# Patient Record
Sex: Male | Born: 1950 | Race: White | Hispanic: No | Marital: Married
Health system: Southern US, Community
[De-identification: ages and names within clinical notes are randomized; demographics above are authoritative.]

## PROBLEM LIST (undated history)

## (undated) DIAGNOSIS — M810 Age-related osteoporosis without current pathological fracture: Secondary | ICD-10-CM

## (undated) DIAGNOSIS — J439 Emphysema, unspecified: Secondary | ICD-10-CM

## (undated) HISTORY — PX: TONSILLECTOMY: SUR1361

## (undated) HISTORY — PX: APPENDECTOMY: SHX54

---

## 2017-04-22 ENCOUNTER — Encounter (HOSPITAL_COMMUNITY): Payer: Self-pay | Admitting: Emergency Medicine

## 2017-04-22 ENCOUNTER — Emergency Department (HOSPITAL_COMMUNITY): Payer: Self-pay

## 2017-04-22 ENCOUNTER — Emergency Department (HOSPITAL_COMMUNITY)
Admission: EM | Admit: 2017-04-22 | Discharge: 2017-04-22 | Disposition: A | Discharge status: Home / Self Care | Payer: Self-pay | Attending: Emergency Medicine | Admitting: Emergency Medicine

## 2017-04-22 DIAGNOSIS — R911 Solitary pulmonary nodule: Secondary | ICD-10-CM | POA: Insufficient documentation

## 2017-04-22 DIAGNOSIS — R42 Dizziness and giddiness: Secondary | ICD-10-CM | POA: Insufficient documentation

## 2017-04-22 DIAGNOSIS — I10 Essential (primary) hypertension: Secondary | ICD-10-CM | POA: Insufficient documentation

## 2017-04-22 HISTORY — DX: Emphysema, unspecified: J43.9

## 2017-04-22 HISTORY — DX: Age-related osteoporosis without current pathological fracture: M81.0

## 2017-04-22 LAB — URINALYSIS, ROUTINE W REFLEX MICROSCOPIC
BACTERIA UA: NONE SEEN
BILIRUBIN URINE: NEGATIVE
Glucose, UA: NEGATIVE mg/dL
Ketones, ur: NEGATIVE mg/dL
LEUKOCYTES UA: NEGATIVE
NITRITE: NEGATIVE
Protein, ur: NEGATIVE mg/dL
Specific Gravity, Urine: 1.002 — ABNORMAL LOW (ref 1.005–1.030)
Squamous Epithelial / LPF: NONE SEEN
pH: 7 (ref 5.0–8.0)

## 2017-04-22 LAB — BASIC METABOLIC PANEL
ANION GAP: 8 (ref 5–15)
BUN: <5 mg/dL — ABNORMAL LOW (ref 6–20)
CO2: 29 mmol/L (ref 22–32)
Calcium: 9.3 mg/dL (ref 8.9–10.3)
Chloride: 99 mmol/L — ABNORMAL LOW (ref 101–111)
Creatinine, Ser: 0.84 mg/dL (ref 0.61–1.24)
GFR calc Af Amer: >60 mL/min (ref >60)
GLUCOSE: 104 mg/dL — AB (ref 65–99)
POTASSIUM: 4.3 mmol/L (ref 3.5–5.1)
Sodium: 136 mmol/L (ref 135–145)

## 2017-04-22 LAB — CBC
HEMATOCRIT: 40.3 % (ref 39.0–52.0)
HEMOGLOBIN: 13.7 g/dL (ref 13.0–17.0)
MCH: 33.8 pg (ref 26.0–34.0)
MCHC: 34 g/dL (ref 30.0–36.0)
MCV: 99.5 fL (ref 78.0–100.0)
Platelets: 234 10*3/uL (ref 150–400)
RBC: 4.05 MIL/uL — ABNORMAL LOW (ref 4.22–5.81)
RDW: 14.2 % (ref 11.5–15.5)
WBC: 7.5 10*3/uL (ref 4.0–10.5)

## 2017-04-22 MED ORDER — SODIUM CHLORIDE 0.9 % IV BOLUS (SEPSIS)
1000.0000 mL | Freq: Once | INTRAVENOUS | Status: AC
  Administered 2017-04-22: 1000 mL via INTRAVENOUS

## 2017-04-22 MED ORDER — DOXYCYCLINE HYCLATE 100 MG PO TABS
200.0000 mg | ORAL_TABLET | Freq: Once | ORAL | Status: AC
  Administered 2017-04-22: 200 mg via ORAL
  Filled 2017-04-22: qty 2

## 2017-04-22 NOTE — ED Triage Notes (Signed)
Patient reports "several" tick bites over the past month. Reports headache, muscle aches, and dizziness with near syncopal episodes x3 days. Hx [place] spotted fever

## 2017-04-22 NOTE — ED Provider Notes (Signed)
[facility]-EMERGENCY DEPT Provider Note   CSN: [id number] Arrival date & time: 04/22/17  1522     History   Chief Complaint Chief Complaint  Patient presents with  . Insect Bite  . Headache  . Dizziness    HPI Kathleen ArgueCharles Mcclary is a 66 y.o. male.  Patient is a 66 year old male with a history of COPD and osteoporosis. He presents with generalized myalgias and weakness. He states he's been feeling bad for about the last week with intermittent dizziness and blurred vision as well as myalgias and joint pains. He denies any joint swelling. No rashes. He's had some intermittent headaches. He has remote [facility] several decades [facility] per his report. He denies any known fevers. He lives mostly time outside and has been all over the country camping. He states he frequently pulls ticks off of him. He denies any cough or chest congestion other than his baseline cough which he attributes to his COPD. He has a little bit of pain in his right mid back. It's worse with movement and coughing. It's not worse with breathing. He denies any shortness of breath. He states he's been eating and drinking okay but does stay outside for most of the day. He denies any nausea vomiting or diarrhea.      Past Medical History:  Diagnosis Date  . Emphysema of lung (HCC)   . Osteoporosis     There are no active problems to display for this patient.   Past Surgical History:  Procedure Laterality Date  . APPENDECTOMY    . TONSILLECTOMY         Home Medications    Prior to Admission medications   Not on File    Family History No family history on file.  Social History Social History  Substance Use Topics  . Smoking status: Not on file  . Smokeless tobacco: Not on file  . Alcohol use Not on file     Allergies   Patient has no known allergies.   Review of Systems Review of Systems  Constitutional: Positive for fatigue. Negative for chills, diaphoresis and fever.    HENT: Negative for congestion, rhinorrhea and sneezing.   Eyes: Negative.   Respiratory: Positive for cough. Negative for chest tightness and shortness of breath.   Cardiovascular: Negative for chest pain and leg swelling.  Gastrointestinal: Negative for abdominal pain, blood in stool, diarrhea, nausea and vomiting.  Genitourinary: Negative for difficulty urinating, flank pain, frequency and hematuria.  Musculoskeletal: Positive for arthralgias and myalgias. Negative for back pain.  Skin: Negative for rash.  Neurological: Positive for dizziness and headaches. Negative for speech difficulty, weakness and numbness.     Physical Exam Updated Vital Signs BP (!) 182/66 (BP Location: Left Arm)   Pulse (!) 52   Temp 97.8 F (36.6 C) (Oral)   Resp 18   Ht 6\' 3"  (1.905 m)   Wt 81.6 kg (180 lb)   SpO2 100%   BMI 22.50 kg/m   Physical Exam  Constitutional: He is oriented to person, place, and time. He appears well-developed and well-nourished.  HENT:  Head: Normocephalic and atraumatic.  Eyes: Pupils are equal, round, and reactive to light.  Neck: Normal range of motion. Neck supple.  Cardiovascular: Normal rate, regular rhythm and normal heart sounds.   Pulmonary/Chest: Effort normal and breath sounds normal. No respiratory distress. He has no wheezes. He has no rales. He exhibits no tenderness.  Abdominal: Soft. Bowel sounds are normal. There is  no tenderness. There is no rebound and no guarding.  Musculoskeletal: Normal range of motion. He exhibits no edema.  No joint swelling or erythema  Lymphadenopathy:    He has no cervical adenopathy.  Neurological: He is alert and oriented to person, place, and time.  Motor 5/5 all extremities Sensation grossly intact to LT all extremities Finger to Nose intact, no pronator drift CN II-XII grossly intact Gait normal   Skin: Skin is warm and dry. No rash noted.  Multiple insect bites.  No induration or surrounding erythema. No rash   Psychiatric: He has a normal mood and affect.     ED Treatments / Results  Labs (all labs ordered are listed, but only abnormal results are displayed) Labs Reviewed  BASIC METABOLIC PANEL - Abnormal; Notable for the following:       Result Value   Chloride 99 (*)    Glucose, Bld 104 (*)    BUN <5 (*)    All other components within normal limits  CBC - Abnormal; Notable for the following:    RBC 4.05 (*)    All other components within normal limits  URINALYSIS, ROUTINE W REFLEX MICROSCOPIC - Abnormal; Notable for the following:    Color, Urine STRAW (*)    Specific Gravity, Urine 1.002 (*)    Hgb urine dipstick SMALL (*)    All other components within normal limits  ROCKY MTN SPOTTED FVR ABS PNL(IGG+IGM)  B. BURGDORFI ANTIBODIES  CBG MONITORING, ED    EKG  EKG Interpretation  Date/Time:  Tuesday April 22 2017 15:46:33 EDT Ventricular Rate:  48 PR Interval:    QRS Duration: 111 QT Interval:  449 QTC Calculation: 402 R Axis:   75 Text Interpretation:  Sinus bradycardia Probable left atrial enlargement Left ventricular hypertrophy Baseline wander in lead(s) V6 Confirmed by Rolan Bucco [phone]) on 04/22/2017 4:03:44 PM       Radiology Dg Chest 2 View  Result Date: 04/22/2017 CLINICAL DATA:  Several tick bites over the past month. Reports headache, muscle ache and dizziness with near syncope x3 days. History of emphysema and [facility] spotted fever. Cough and weakness. EXAM: CHEST  2 VIEW COMPARISON:  None. FINDINGS: There is emphysematous hyperinflation of the lungs without pneumonic consolidation. Bilateral diffuse interstitial prominence consistent with chronic interstitial lung disease and interstitial fibrosis is noted bilaterally. There is a well-circumscribed dense appearing 12 mm pulmonary nodule in the left upper lobe possibly sequela of old granulomatous disease or possibly a hamartoma. CT may help for baseline documentation and assessment. Biapical  pleuroparenchymal scarring is seen. Tiny nodular density at the right lung base likely reflects the patient's nipple shadow. Mild degenerative changes are seen along the dorsal spine. Heart and mediastinal contours within normal limits. There is minimal aortic atherosclerosis. IMPRESSION: 1. Emphysematous hyperinflation of the lungs. Diffuse interstitial prominence with fine reticular markings consistent with chronic interstitial lung disease and areas of bilateral interstitial fibrosis. 2. Well-circumscribed 12 mm dense nodule in the left upper lobe may represent sequela of old granulomatous disease or hamartoma among some considerations though not exclusive. Tiny nodular density at the right lung base may reflect the patient's nipple shadow. A nonemergent baseline chest CT may help for further correlation unless the patient has had prior imaging elsewhere that documents the stability this finding and would obviate the need for additional imaging. Electronically Signed   By: Tollie Eth M.D.   On: 04/22/2017 16:34    Procedures Procedures (including critical care time)  Medications  Ordered in ED Medications  doxycycline (VIBRA-TABS) tablet 200 mg (not administered)  sodium chloride 0.9 % bolus 1,000 mL (1,000 mLs Intravenous New Bag/Given 04/22/17 1656)     Initial Impression / Assessment and Plan / ED Course  I have reviewed the triage vital signs and the nursing notes.  Pertinent labs & imaging results that were available during my care of the patient were reviewed by me and considered in my medical decision making (see chart for details).      Patient is a 66 year old who presents with lightheadedness and fatigue. He doesn't have a rash, fever or other symptoms that would be more consistent with an acute tick borne illness. I did however draw titers for [facility] spotted fever and Lyme disease. However he don't feel at this point that he needs a full course of treatment. Given his recent  tick removal yesterday, I will go ahead and treat him prophylactically with one dose of doxycycline. He was given IV fluids and a meal here in the ED. His symptoms have essentially resolved. He has no headache. He has no neurologic deficits. He's ambulating without difficulty. His labs are non-concerning. Given this I feel he can be discharged home with outpatient follow-up. I encouraged him to follow-up with her primary care physician. He was given outpatient resources for follow-up. His blood pressure is a little bit elevated in the ED and I did advise him that he'll need to have this rechecked. His heart rate is in the 50s and 60s. He doesn't appear to be symptomatic from this. He's ambulating currently without dizziness. His blood pressure has not been hypotensive. I feel this can also be followed up as an outpatient. He's not on any medications that would lower his heart rate. He states that he's been told his heart rates been low in the past. He is in a sinus rhythm. I also advised him that he has a pulmonary nodule that will need outpatient follow-up. He states that he feels like he's had this in the past. He was advised to return if he has any worsening symptoms  Final Clinical Impressions(s) / ED Diagnoses   Final diagnoses:  Essential hypertension  Pulmonary nodule  Lightheadedness    New Prescriptions New Prescriptions   No medications on file     Rolan Bucco, MD 04/22/17 1819

## 2017-04-23 LAB — B. BURGDORFI ANTIBODIES

## 2017-04-24 LAB — ROCKY MTN SPOTTED FVR ABS PNL(IGG+IGM)
RMSF IgG: NEGATIVE
RMSF IgM: 0.31 index (ref 0.00–0.89)

## 2019-02-14 IMAGING — CR DG CHEST 2V
2 series · 2 of 2 positions shown · non-contrast
Comparison: None.

CLINICAL DATA: Several tick bites over the past month. Reports
headache, muscle ache and dizziness with near syncope x3 days.
History of emphysema and [facility] fever. Cough and
weakness.

EXAM:
CHEST  2 VIEW

[w chest pa]
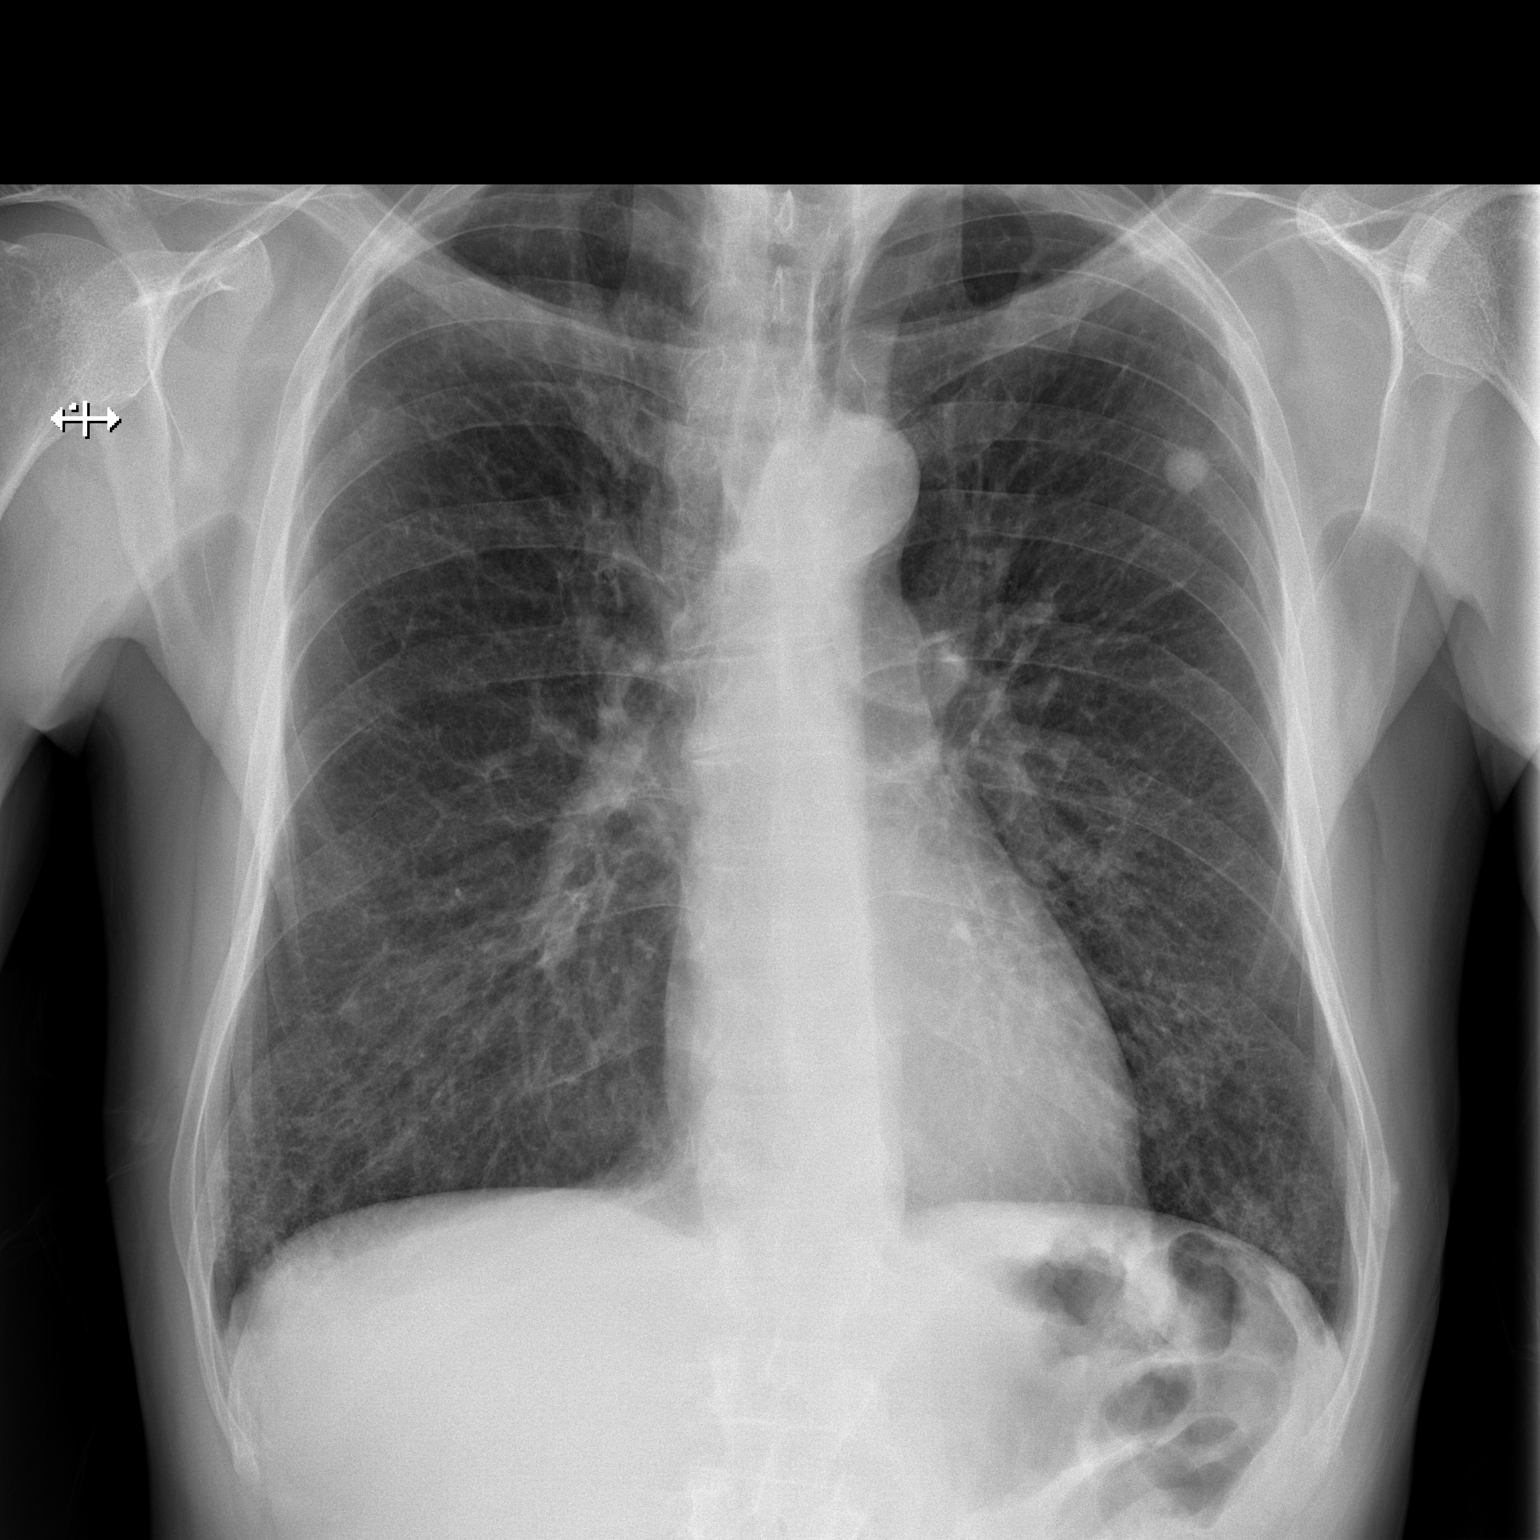

[w chest lat]
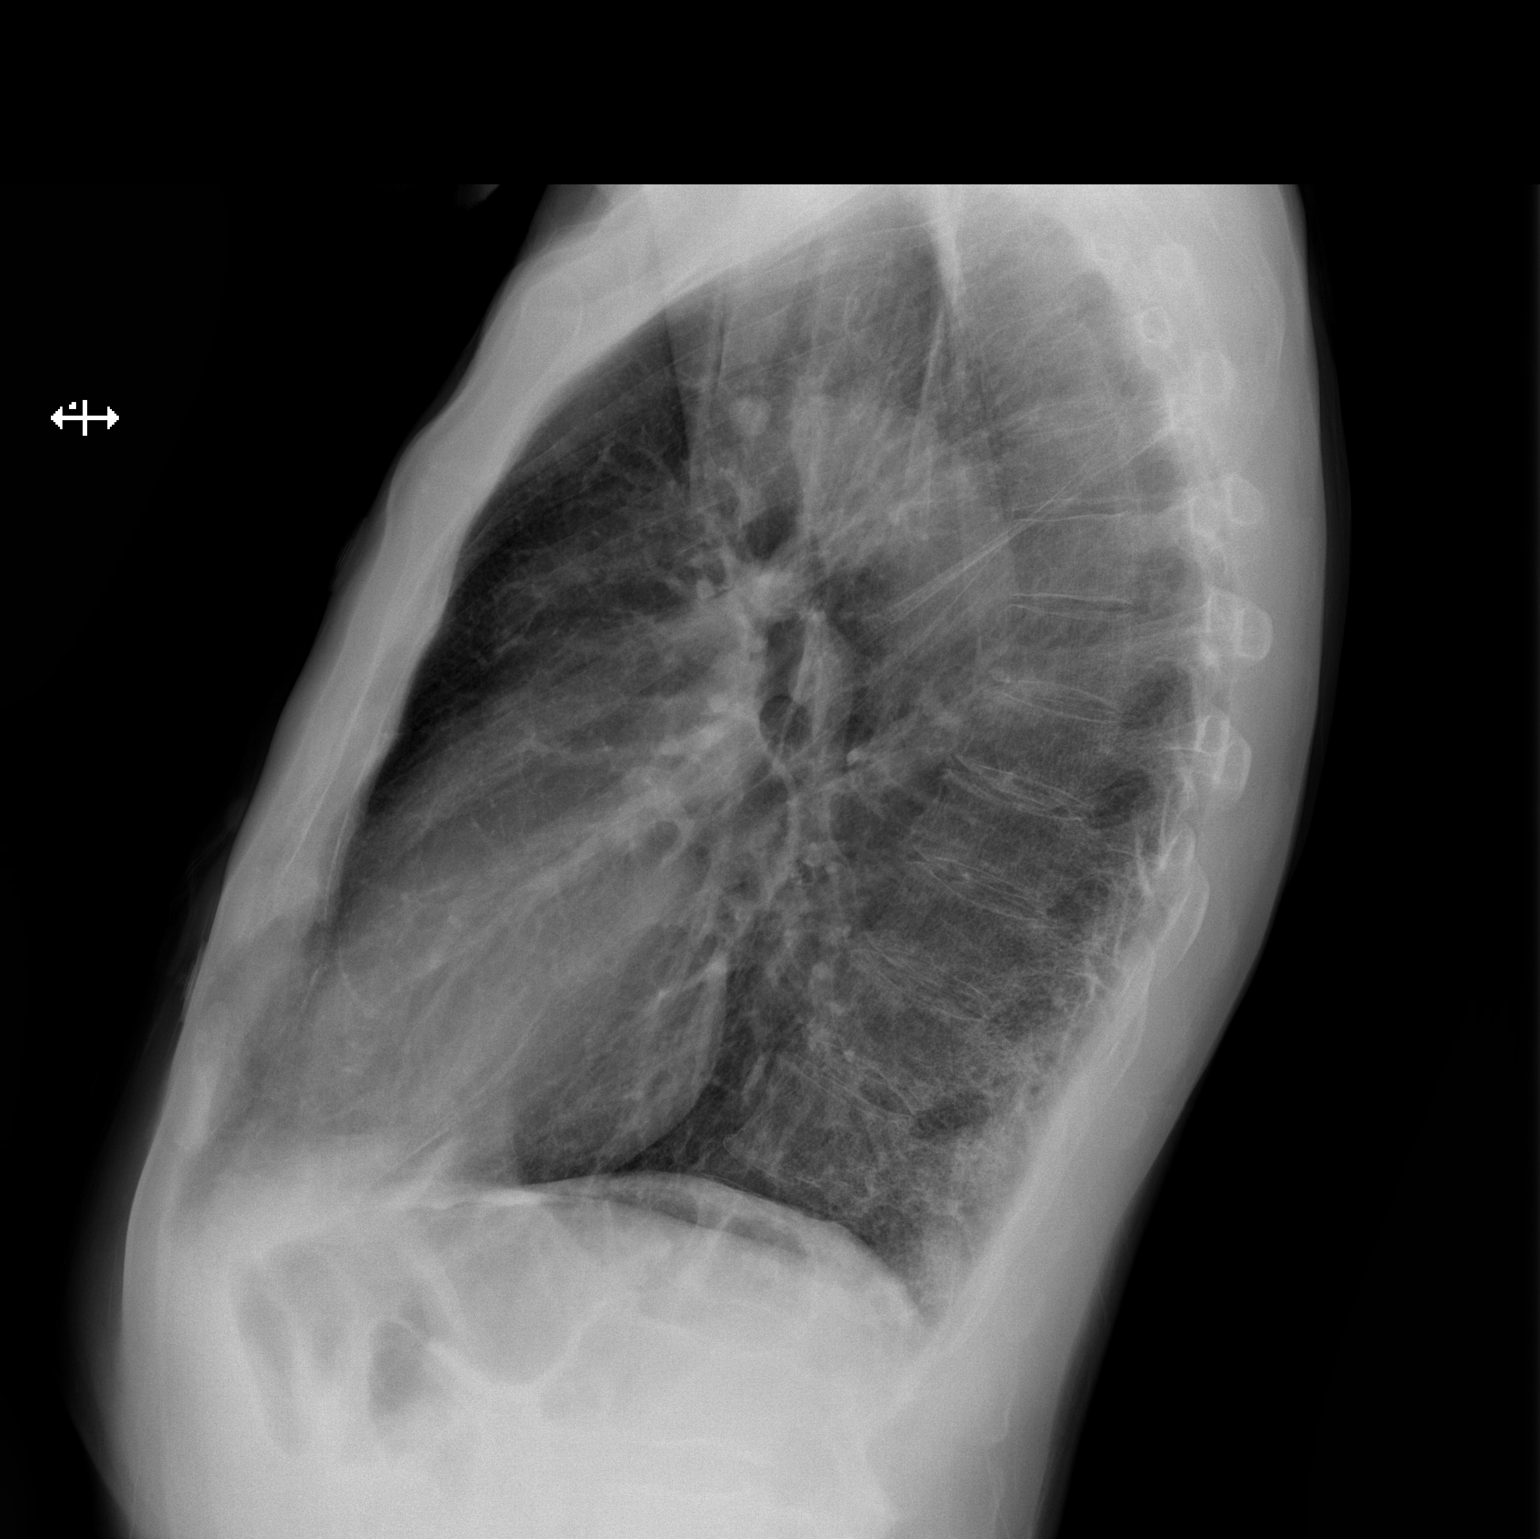

[2 of 2 positions shown; findings below may reference images not displayed]

FINDINGS: There is emphysematous hyperinflation of the lungs without pneumonic
consolidation. Bilateral diffuse interstitial prominence consistent
with chronic interstitial lung disease and interstitial fibrosis is
noted bilaterally. There is a well-circumscribed dense appearing 12
mm pulmonary nodule in the left upper lobe possibly sequela of old
granulomatous disease or possibly a hamartoma. CT may help for
baseline documentation and assessment. Biapical pleuroparenchymal
scarring is seen. Tiny nodular density at the right lung base likely
reflects the patient's nipple shadow. Mild degenerative changes are
seen along the dorsal spine. Heart and mediastinal contours within
normal limits. There is minimal aortic atherosclerosis.
IMPRESSION: 1. Emphysematous hyperinflation of the lungs. Diffuse interstitial
prominence with fine reticular markings consistent with chronic
interstitial lung disease and areas of bilateral interstitial
fibrosis.
2. Well-circumscribed 12 mm dense nodule in the left upper lobe may
represent sequela of old granulomatous disease or hamartoma among
some considerations though not exclusive. Tiny nodular density at
the right lung base may reflect the patient's nipple shadow. A
nonemergent baseline chest CT may help for further correlation
unless the patient has had prior imaging elsewhere that documents
the stability this finding and would obviate the need for additional
imaging.
